# Patient Record
Sex: Male | Born: 1955 | Race: White | Hispanic: No | Marital: Married | Smoking: Never smoker
Health system: Southern US, Community
[De-identification: ages and names within clinical notes are randomized; demographics above are authoritative.]

## PROBLEM LIST (undated history)

## (undated) DIAGNOSIS — E785 Hyperlipidemia, unspecified: Secondary | ICD-10-CM

## (undated) DIAGNOSIS — T7840XA Allergy, unspecified, initial encounter: Secondary | ICD-10-CM

## (undated) HISTORY — DX: Hyperlipidemia, unspecified: E78.5

## (undated) HISTORY — DX: Gilbert syndrome: E80.4

## (undated) HISTORY — DX: Allergy, unspecified, initial encounter: T78.40XA

---

## 2000-04-03 ENCOUNTER — Encounter: Payer: Self-pay | Admitting: Emergency Medicine

## 2000-04-03 ENCOUNTER — Emergency Department (HOSPITAL_COMMUNITY): Admission: EM | Admit: 2000-04-03 | Discharge: 2000-04-03 | Payer: Self-pay | Admitting: Emergency Medicine

## 2000-04-07 ENCOUNTER — Emergency Department (HOSPITAL_COMMUNITY): Admission: EM | Admit: 2000-04-07 | Discharge: 2000-04-07 | Payer: Self-pay | Admitting: Emergency Medicine

## 2000-04-11 ENCOUNTER — Emergency Department (HOSPITAL_COMMUNITY): Admission: EM | Admit: 2000-04-11 | Discharge: 2000-04-11 | Payer: Self-pay | Admitting: Emergency Medicine

## 2004-08-02 HISTORY — PX: UMBILICAL HERNIA REPAIR: SHX196

## 2004-08-19 ENCOUNTER — Ambulatory Visit: Payer: Self-pay | Admitting: Internal Medicine

## 2004-08-20 ENCOUNTER — Ambulatory Visit (HOSPITAL_BASED_OUTPATIENT_CLINIC_OR_DEPARTMENT_OTHER): Admission: RE | Admit: 2004-08-20 | Discharge: 2004-08-20 | Payer: Self-pay | Admitting: General Surgery

## 2004-09-11 ENCOUNTER — Ambulatory Visit: Payer: Self-pay | Admitting: Internal Medicine

## 2004-09-14 ENCOUNTER — Encounter: Admission: RE | Admit: 2004-09-14 | Discharge: 2004-09-14 | Payer: Self-pay | Admitting: Internal Medicine

## 2005-04-16 IMAGING — CR DG HIP (WITH OR WITHOUT PELVIS) 2-3V*L*
3 series · 3 of 3 positions shown · non-contrast
Comparison: none

CLINICAL DATA: Patient has had low back pain for approximately one year.  There is no known injury.  Pain has gradually gotten worse and has a tendency to radiate into the left lower back and hip area.  
 LUMBOSACRAL SPINE COMPLETE:
 AP, lateral, and both oblique views of the lumbar spine show no evidence of fracture, dislocation, or congenital abnormality.  There are degenerative hypertrophic spurs noted at the L2-3 and L4-5 levels.  There is some narrowing of the L5-S1 intervertebral disc space with hypertrophic spurring anteriorly at that level.  The posterior elements appear to be intact.

[view not recorded (1 of 3)]
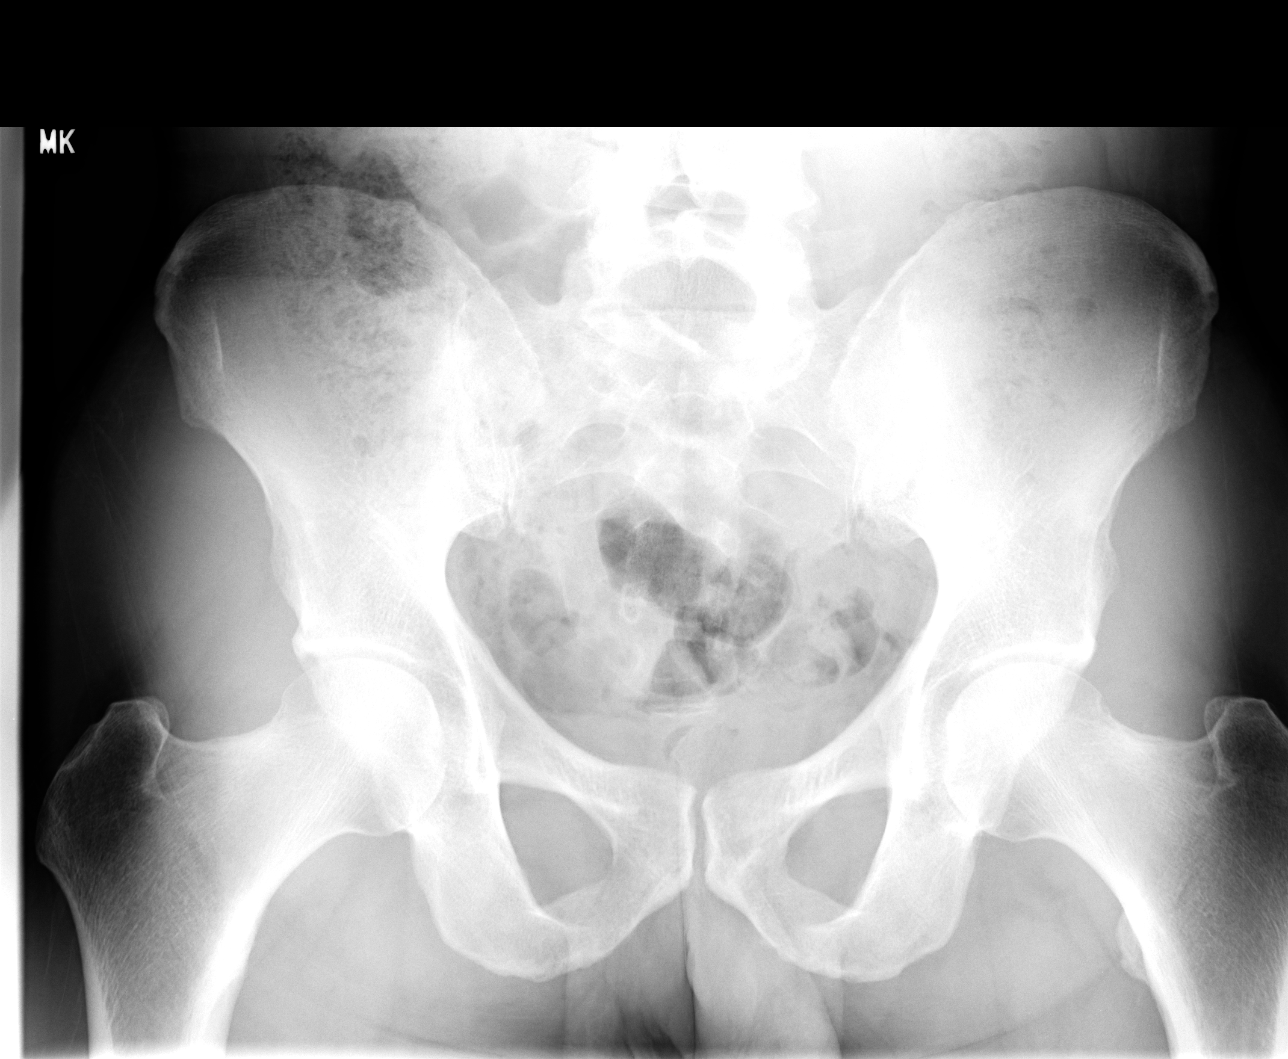

[view not recorded (2 of 3)]
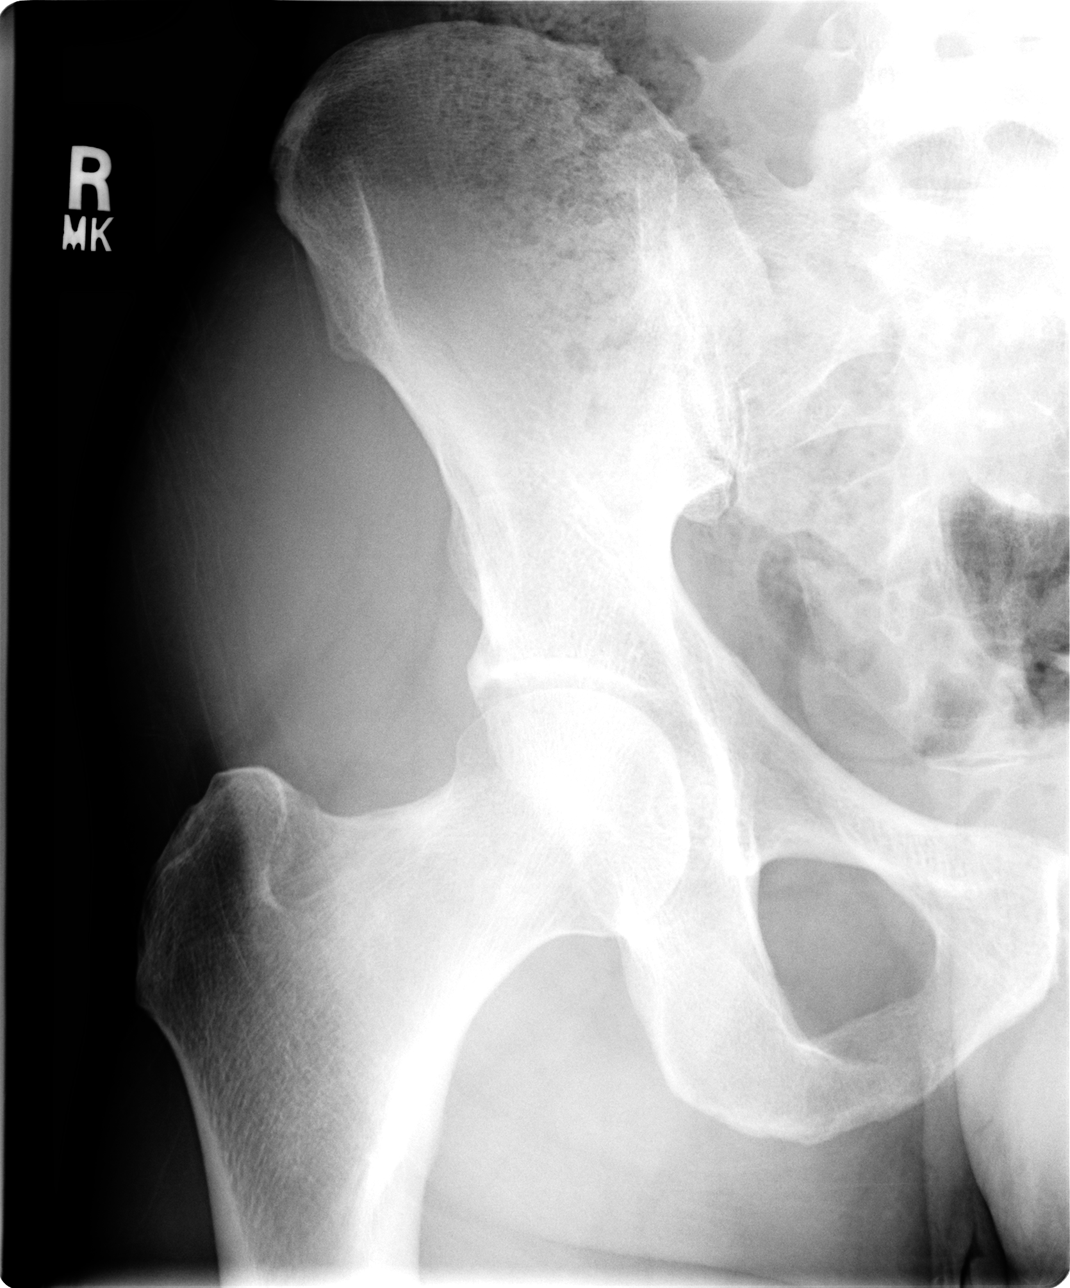

[view not recorded (3 of 3)]
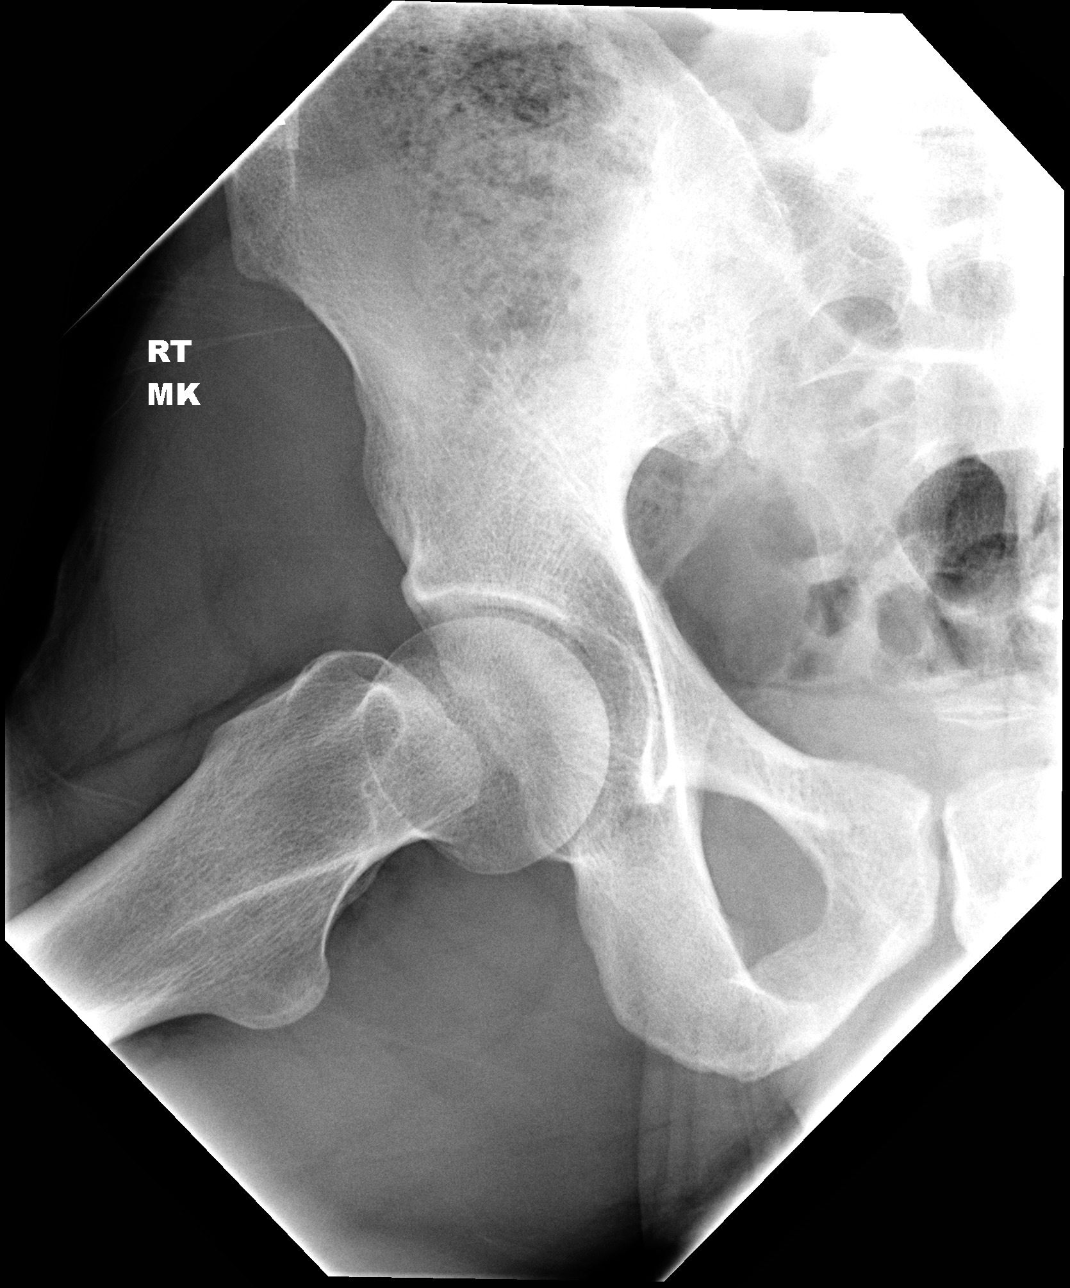

[3 of 3 positions shown; findings below may reference images not displayed]

IMPRESSION: Some degenerative hypertrophic spurring mid and lower lumbar spine.  Mild narrowing L5-S1 joint space suggesting some degenerative disc at that level with some hypertrophic spurring.  
 LEFT HIP COMPLETE:
 AP and lateral views of the left hip as well as a PA view of the pelvis show no evidence of fracture, dislocation, or radiopaque foreign body.  The soft tissues appear normal and the sacroiliac joints are normal.
IMPRESSION: Normal left hip.
 RIGHT HIP COMPLETE:
 AP and lateral views of the right hip show no evidence of fracture, dislocation, or congenital abnormality.  The right sacroiliac joint appears normal.
IMPRESSION: Normal right hip.

## 2005-08-02 HISTORY — PX: LUMBAR EPIDURAL INJECTION: SHX1980

## 2005-12-13 ENCOUNTER — Encounter: Admission: RE | Admit: 2005-12-13 | Discharge: 2005-12-13 | Payer: Self-pay | Admitting: Orthopedic Surgery

## 2005-12-28 ENCOUNTER — Encounter: Admission: RE | Admit: 2005-12-28 | Discharge: 2005-12-28 | Payer: Self-pay | Admitting: Orthopedic Surgery

## 2006-03-04 ENCOUNTER — Encounter: Admission: RE | Admit: 2006-03-04 | Discharge: 2006-03-04 | Payer: Self-pay | Admitting: Orthopedic Surgery

## 2006-11-01 ENCOUNTER — Ambulatory Visit: Payer: Self-pay | Admitting: Internal Medicine

## 2006-11-09 ENCOUNTER — Ambulatory Visit: Payer: Self-pay | Admitting: Internal Medicine

## 2007-04-07 ENCOUNTER — Ambulatory Visit: Payer: Self-pay | Admitting: Internal Medicine

## 2007-04-09 LAB — CONVERTED CEMR LAB
AST: 18 units/L (ref 0–37)
Total CHOL/HDL Ratio: 3.7

## 2007-04-10 ENCOUNTER — Encounter (INDEPENDENT_AMBULATORY_CARE_PROVIDER_SITE_OTHER): Payer: Self-pay | Admitting: *Deleted

## 2008-08-02 HISTORY — PX: COLONOSCOPY: SHX174

## 2008-11-05 ENCOUNTER — Telehealth: Payer: Self-pay | Admitting: Internal Medicine

## 2008-11-06 ENCOUNTER — Ambulatory Visit: Payer: Self-pay | Admitting: Internal Medicine

## 2008-11-06 LAB — CONVERTED CEMR LAB
AST: 19 units/L (ref 0–37)
Albumin: 4.2 g/dL (ref 3.5–5.2)
Basophils Absolute: 0 10*3/uL (ref 0.0–0.1)
Basophils Relative: 0.9 % (ref 0.0–3.0)
Bilirubin, Direct: 0.2 mg/dL (ref 0.0–0.3)
CO2: 31 meq/L (ref 19–32)
Chloride: 105 meq/L (ref 96–112)
Creatinine, Ser: 0.8 mg/dL (ref 0.4–1.5)
Direct LDL: 125.4 mg/dL
GFR calc non Af Amer: 108.08 mL/min (ref >60)
Hemoglobin: 14 g/dL (ref 13.0–17.0)
Lymphocytes Relative: 31.3 % (ref 12.0–46.0)
Lymphs Abs: 1.4 10*3/uL (ref 0.7–4.0)
MCHC: 34.3 g/dL (ref 30.0–36.0)
Monocytes Absolute: 0.4 10*3/uL (ref 0.1–1.0)
Monocytes Relative: 8.8 % (ref 3.0–12.0)
PSA: 0.44 ng/mL (ref 0.10–4.00)
Potassium: 4.5 meq/L (ref 3.5–5.1)
RDW: 13.3 % (ref 11.5–14.6)
Total Protein: 6.8 g/dL (ref 6.0–8.3)
VLDL: 7.4 mg/dL (ref 0.0–40.0)
WBC: 4.4 10*3/uL — ABNORMAL LOW (ref 4.5–10.5)

## 2008-11-08 ENCOUNTER — Encounter (INDEPENDENT_AMBULATORY_CARE_PROVIDER_SITE_OTHER): Payer: Self-pay | Admitting: *Deleted

## 2008-11-08 ENCOUNTER — Ambulatory Visit: Payer: Self-pay | Admitting: Internal Medicine

## 2008-11-08 DIAGNOSIS — R7309 Other abnormal glucose: Secondary | ICD-10-CM | POA: Insufficient documentation

## 2008-11-08 DIAGNOSIS — D239 Other benign neoplasm of skin, unspecified: Secondary | ICD-10-CM | POA: Insufficient documentation

## 2008-11-08 DIAGNOSIS — J309 Allergic rhinitis, unspecified: Secondary | ICD-10-CM | POA: Insufficient documentation

## 2008-11-08 DIAGNOSIS — E785 Hyperlipidemia, unspecified: Secondary | ICD-10-CM | POA: Insufficient documentation

## 2008-11-18 ENCOUNTER — Ambulatory Visit: Payer: Self-pay | Admitting: Internal Medicine

## 2008-11-29 ENCOUNTER — Encounter: Payer: Self-pay | Admitting: Internal Medicine

## 2008-12-02 ENCOUNTER — Ambulatory Visit: Payer: Self-pay | Admitting: Internal Medicine

## 2010-08-30 LAB — CONVERTED CEMR LAB
Basophils Absolute: 0 10*3/uL (ref 0.0–0.1)
Basophils Relative: 0.5 % (ref 0.0–1.0)
Bilirubin, Direct: 0.1 mg/dL (ref 0.0–0.3)
Eosinophils Absolute: 0.1 10*3/uL (ref 0.0–0.6)
Eosinophils Relative: 2.3 % (ref 0.0–5.0)
Glucose, Bld: 86 mg/dL (ref 70–99)
Hemoglobin: 14.9 g/dL (ref 13.0–17.0)
Lymphocytes Relative: 25.9 % (ref 12.0–46.0)
MCHC: 34.1 g/dL (ref 30.0–36.0)
Monocytes Absolute: 0.4 10*3/uL (ref 0.2–0.7)
Monocytes Relative: 6.9 % (ref 3.0–11.0)
Neutro Abs: 3.4 10*3/uL (ref 1.4–7.7)
Neutrophils Relative %: 64.4 % (ref 43.0–77.0)
Platelets: 267 10*3/uL (ref 150–400)
Potassium: 4.5 meq/L (ref 3.5–5.1)
RBC: 4.72 M/uL (ref 4.22–5.81)
RDW: 12.8 % (ref 11.5–14.6)
Total Protein: 7.3 g/dL (ref 6.0–8.3)

## 2010-11-09 ENCOUNTER — Other Ambulatory Visit: Payer: Self-pay | Admitting: Internal Medicine

## 2010-12-05 ENCOUNTER — Other Ambulatory Visit: Payer: Self-pay | Admitting: Internal Medicine

## 2010-12-18 NOTE — Op Note (Signed)
Daniel Jordan, Daniel Jordan               ACCOUNT NO.:  [id number]   MEDICAL RECORD NO.:  [id number]          PATIENT TYPE:  AMB   LOCATION:  [facility]                         FACILITY:  [facility]   PHYSICIAN:  Angelia Mould. Derrell Lolling, M.D.DATE OF BIRTH:  January 10, 1956   DATE OF PROCEDURE:  08/20/2004  DATE OF DISCHARGE:                                 OPERATIVE REPORT   PREOPERATIVE DIAGNOSIS:  Incarcerated umbilical hernia.   POSTOPERATIVE DIAGNOSIS:  Incarcerated epigastric ventral hernia.   OPERATION PERFORMED:  Repair incarcerated epigastric ventral hernia.   SURGEON:  Angelia Mould. Derrell Lolling, M.D.   OPERATIVE INDICATIONS:  This is a 55 year old white male who has had a small  reducible hernia above his umbilicus for some time, but over the last 48  hours it has become incarcerated and is painful. He was seen in the office  yesterday afternoon and he has about a 1.5-2.0 cm tender subcutaneous mass  about 3 cm above the umbilicus. This no overlying skin change. There is no  abdominal distension or abdominal tenderness. He is brought to operating  room for repair.   OPERATIVE TECHNIQUE:  Following the induction of a monitored sedation, the  patient's abdomen was prepped and draped in sterile fashion. 0.5% Marcaine  with epinephrine was used as local infiltration anesthetic. A curved  transverse incision was made at 2 cm above the umbilicus. Dissection was  carried down through subcutaneous tissue until hernia sac was identified.  This appeared to contain a tongue of preperitoneal fat. This was dissected  down to the fascia and freed up. I amputated some of the fatty tissue with  electrocautery and reduced the rest.  I checked inferiorly and he really did  not have an umbilical hernia. The fascia at the umbilicus was intact. I  repaired the hernia defect with three interrupted sutures of 0 Novofil. The  central suture was placed as a vest over pants suture and then the two  corner stitches were simple  sutures. Once all the sutures were placed, they  were pulled up and then tied.  This provided very secure repair.  I did not  think any prosthetic mesh was indicated.  The wound was irrigated with  saline. Subcutaneous tissue was closed with interrupted sutures of 3-0  Vicryl. Skin was closed with running subcuticular suture of 4-0 Vicryl and  Steri-Strips. Clean bandages were placed. The patient taken recovery room in  stable condition.  Estimated blood loss was about 5 cc. Complications none.  Sponge, needle and instrument counts were correct.     Hayw  HMI/MEDQ  D:  08/20/2004  T:  08/20/2004  Job:  [id number]   cc:   Wanda Plump, MD [facility]  [phone] [place], [place] [address]   Titus Dubin. Alwyn Ren, M.D. [facility]

## 2010-12-18 NOTE — Assessment & Plan Note (Signed)
[facility]                        [place] OFFICE NOTE   KORVIN, VALENTINE                      MRN:          [id number]  DATE:11/01/2006                            DOB:          Nov 20, 1955    Mr. Nohr was seen for a physical examination November 01, 2006 at age 55.   He is essentially asymptomatic.   PAST MEDICAL HISTORY:  Unchanged. He had an umbilical hernia repair in  January 2006. He has had dyslipidemia and also Gilbert's syndrome,  benign elevation of total bilirubin.   FAMILY HISTORY:  Hypertension in his mother and myocardial infarction at  age 89 in his father. His father did have a stent in 2005.   He has never smoked. He drinks socially. He has been exercising using  weights and also cardiovascular exercise approximately 60 minutes per  week. He has no cardiopulmonary symptoms with this.   He is presently on no medications and there are no known drug allergies.   REVIEW OF SYSTEMS:  He continues to have significant night sweats  approximately once every 6-7 weeks. This began after a trip to [place] in  July 2005. One of his daughters also had similar phenomena. There is no  history of fever, chills or weight loss or pulmonary symptomatology.   Additionally he has seasonal allergies but does not take medication.   He has noted some decrease in his stream but has no other genitourinary  symptoms.   The remainder of the review of systems was completed in toto and was  negative.   He is 5 foot 11 and weighs 163 fully clothed. Pulse is 72, respiratory  rate 16 and blood pressure 120/74. Pupils were equally round and  reactive to light. Fundal exam unremarkable.   Dental hygiene is excellent. He has no lymphadenopathy about the head,  neck or axilla. Thyroid is normal to palpation. The nasal septum is  slightly deviated. There is no purulence. Tympanic membranes are normal.   The chest is clear to auscultation with no  wheezing.   An S4 was noted with no murmurs or gallops.   He has no lymphadenopathy about the head, neck or axilla.   Abdomen is flat and well muscled. He has no organomegaly.   All pulses are intact and there is no edema.   He does have a  varicosity of the left lower extremity which is present  when standing. It does disappear with elevation.   Musculoskeletal exam is unremarkable. This is significant in that he  previously had significant low back syndrome that was evaluated by an  orthopedist and neurosurgeon. The last evaluation was at [facility] and  conservative treatment was recommended which has resulted in resolution  of symptoms.   Prostate is upper limits of normal to mildly enlarged. He has a  granuloma of his left scrotal area. Hemoccult test is negative.   Neuropsychiatric exam is unremarkable.   In reviewing the prior labs, his LDL has ranged from 122 to 179. With  his father's history of cardiovascular disease, it is imperative to  determine his risk. An NMR LipoProfile  will be performed. It was  recommended that he follow nutritional information in Dr. Gildardo Griffes book  Eat, Drink and be Healthy. Also he is referred to [employer], prevention.com, the Flat Belly Diet.   He was given the option of using loratadine 10 mg daily as needed for  allergic symptoms. He can also use a EchoStar daily as needed.   Saw Palmetto Extract supplement may be of benefit for the minor  genitourinary symptoms. Support hose should address the minor  varicosity.   A goal sheet was provided. It is recommended that cardiovascular  exercise be increased to 30-45 minutes 3 or 4 times a week. EKG reveals  no ischemic changes, no contraindication based on the physical to such  an exercise program.     Titus Dubin. Alwyn Ren, MD,FACP,FCCP  Electronically Signed    [facility]/MedQ  DD: 11/01/2006  DT: 11/01/2006  Job #: [id number]

## 2011-02-13 ENCOUNTER — Encounter: Payer: Self-pay | Admitting: Internal Medicine

## 2011-02-15 ENCOUNTER — Ambulatory Visit (INDEPENDENT_AMBULATORY_CARE_PROVIDER_SITE_OTHER): Payer: BC Managed Care – PPO | Admitting: Internal Medicine

## 2011-02-15 ENCOUNTER — Encounter: Payer: Self-pay | Admitting: Internal Medicine

## 2011-02-15 VITALS — BP 120/86 | HR 69 | Temp 98.2°F | Ht 71.5 in | Wt 161.8 lb

## 2011-02-15 DIAGNOSIS — D239 Other benign neoplasm of skin, unspecified: Secondary | ICD-10-CM

## 2011-02-15 DIAGNOSIS — Z8249 Family history of ischemic heart disease and other diseases of the circulatory system: Secondary | ICD-10-CM

## 2011-02-15 DIAGNOSIS — Z0000 Encounter for general adult medical examination without abnormal findings: Secondary | ICD-10-CM

## 2011-02-15 DIAGNOSIS — E785 Hyperlipidemia, unspecified: Secondary | ICD-10-CM

## 2011-02-15 DIAGNOSIS — Z23 Encounter for immunization: Secondary | ICD-10-CM

## 2011-02-15 LAB — CBC WITH DIFFERENTIAL/PLATELET
Basophils Absolute: 0 10*3/uL (ref 0.0–0.1)
Basophils Relative: 0.7 % (ref 0.0–3.0)
Eosinophils Absolute: 0.2 10*3/uL (ref 0.0–0.7)
HCT: 42.2 % (ref 39.0–52.0)
Hemoglobin: 14.3 g/dL (ref 13.0–17.0)
MCV: 93.7 fl (ref 78.0–100.0)
Monocytes Relative: 8.2 % (ref 3.0–12.0)
RBC: 4.5 Mil/uL (ref 4.22–5.81)
WBC: 4.7 10*3/uL (ref 4.5–10.5)

## 2011-02-15 LAB — BASIC METABOLIC PANEL
CO2: 28 mEq/L (ref 19–32)
Calcium: 8.9 mg/dL (ref 8.4–10.5)

## 2011-02-15 LAB — HEPATIC FUNCTION PANEL
AST: 17 U/L (ref 0–37)
Albumin: 4.6 g/dL (ref 3.5–5.2)
Alkaline Phosphatase: 39 U/L (ref 39–117)
Bilirubin, Direct: 0.2 mg/dL (ref 0.0–0.3)
Total Protein: 7.4 g/dL (ref 6.0–8.3)

## 2011-02-15 LAB — TSH: TSH: 1.63 u[IU]/mL (ref 0.35–5.50)

## 2011-02-15 LAB — LIPID PANEL
LDL Cholesterol: 110 mg/dL — ABNORMAL HIGH (ref 0–99)
Total CHOL/HDL Ratio: 2

## 2011-02-15 MED ORDER — LORATADINE 10 MG PO TABS: 10.0000 mg | ORAL_TABLET | Freq: Every day | ORAL | Status: DC

## 2011-02-15 MED ORDER — TETANUS-DIPHTH-ACELL PERTUSSIS 5-2.5-18.5 LF-MCG/0.5 IM SUSP
0.5000 mL | Freq: Once | INTRAMUSCULAR | Status: AC
  Administered 2011-02-15: 0.5 mL via INTRAMUSCULAR

## 2011-02-15 NOTE — Patient Instructions (Addendum)
Your LDL goal = < 120. Avoid stimulants :decongestants, diet pills, nicotine, caffeine( coffee, tea,cola, chocolate) to excess to prevent  palpitations

## 2011-02-15 NOTE — Progress Notes (Signed)
Subjective:    Patient ID: Daniel Jordan, male    DOB: [birth year], 55 y.o.   MRN: [id number]  HPI  Mr Bifulco  is here for a physical; he has no acute issues.      Review of Systems Patient reports no  vision/ hearing changes,anorexia, weight change, fever ,adenopathy, persistant / recurrent hoarseness, swallowing issues, chest pain,palpitations, edema,persistant / recurrent cough, hemoptysis, dyspnea(rest, exertional, paroxysmal nocturnal), gastrointestinal  bleeding (melena, rectal bleeding), abdominal pain, excessive heart burn, GU symptoms( dysuria, hematuria, pyuria, voiding/incontinence  issues) syncope, focal weakness, memory loss,numbness & tingling, skin/hair/nail changes,depression, anxiety, abnormal bruising/bleeding, or musculoskeletal symptoms/signs.      Objective:   Physical Exam Gen.: Thin but healthy and well-nourished in appearance. Alert, appropriate and cooperative throughout exam. Head: Normocephalic without obvious abnormalities;   goatee Eyes: No corneal or conjunctival inflammation noted. Pupils equal round reactive to light and accommodation. Fundal exam is benign without hemorrhages, exudate, papilledema. Extraocular motion intact. Vision grossly normal. Ears: External  ear exam reveals no significant lesions or deformities. Canals clear .TMs normal. Hearing is grossly normal bilaterally. Nose: External nasal exam reveals no deformity or inflammation. Nasal mucosa are pink and moist. No lesions or exudates noted. Septum  : deviated to R  Mouth: Oral mucosa and oropharynx reveal no lesions or exudates. Teeth in good repair. Neck: No deformities, masses, or tenderness noted. Range of motion & Thyroid normal. Lungs: Normal respiratory effort; chest expands symmetrically. Lungs are clear to auscultation without rales, wheezes, or increased work of breathing. Heart: Normal rate and rhythm. Normal S1 and S2. No gallop, click, or rub. S4 w/o  murmur. Abdomen: Bowel sounds  normal; abdomen soft and nontender. No masses, organomegaly or hernias noted. Genitalia/ DRE: A small granuloma is noted above the left testicle. There is dimpling over the lower spine without fluctuance or tenderness. Prostate is normal   .                                                                                   Musculoskeletal/extremities: No deformity or scoliosis noted of  the thoracic or lumbar spine. No clubbing, cyanosis, edema, or deformity noted. Range of motion  normal .Tone & strength  normal.Joints normal. Nail health  good. Vascular: Carotid, radial artery, dorsalis pedis and  posterior tibial pulses are full and equal. No bruits present. Neurologic: Alert and oriented x3. Deep tendon reflexes symmetrical and normal.          Skin: Intact without suspicious lesions or rashes.Well tanned Lymph: No cervical, axillary, or inguinal lymphadenopathy present. Psych: Mood and affect are normal. Normally interactive                                                                                         Assessment & Plan:  #1 comprehensive physical exam; no acute  findings #2 see Problem List with Assessments & Recommendations Plan: see Orders

## 2014-01-22 ENCOUNTER — Encounter: Payer: Self-pay | Admitting: Internal Medicine

## 2014-01-22 ENCOUNTER — Other Ambulatory Visit (INDEPENDENT_AMBULATORY_CARE_PROVIDER_SITE_OTHER): Payer: BC Managed Care – PPO

## 2014-01-22 ENCOUNTER — Ambulatory Visit (INDEPENDENT_AMBULATORY_CARE_PROVIDER_SITE_OTHER): Payer: BC Managed Care – PPO | Admitting: Internal Medicine

## 2014-01-22 VITALS — BP 146/98 | HR 59 | Temp 98.2°F | Ht 72.0 in | Wt 162.0 lb

## 2014-01-22 DIAGNOSIS — Z0000 Encounter for general adult medical examination without abnormal findings: Secondary | ICD-10-CM

## 2014-01-22 DIAGNOSIS — J309 Allergic rhinitis, unspecified: Secondary | ICD-10-CM

## 2014-01-22 LAB — CBC WITH DIFFERENTIAL/PLATELET
BASOS ABS: 0 10*3/uL (ref 0.0–0.1)
BASOS PCT: 0.4 % (ref 0.0–3.0)
EOS PCT: 5.5 % — AB (ref 0.0–5.0)
Eosinophils Absolute: 0.3 10*3/uL (ref 0.0–0.7)
HEMATOCRIT: 45.4 % (ref 39.0–52.0)
HEMOGLOBIN: 15.2 g/dL (ref 13.0–17.0)
LYMPHS ABS: 1.9 10*3/uL (ref 0.7–4.0)
Lymphocytes Relative: 32.5 % (ref 12.0–46.0)
MCHC: 33.5 g/dL (ref 30.0–36.0)
MCV: 93.5 fl (ref 78.0–100.0)
MONOS PCT: 7.5 % (ref 3.0–12.0)
Monocytes Absolute: 0.4 10*3/uL (ref 0.1–1.0)
NEUTROS ABS: 3.2 10*3/uL (ref 1.4–7.7)
NEUTROS PCT: 54.1 % (ref 43.0–77.0)
Platelets: 233 10*3/uL (ref 150.0–400.0)
RBC: 4.85 Mil/uL (ref 4.22–5.81)
RDW: 14.2 % (ref 11.5–15.5)
WBC: 5.9 10*3/uL (ref 4.0–10.5)

## 2014-01-22 LAB — BASIC METABOLIC PANEL
BUN: 17 mg/dL (ref 6–23)
CALCIUM: 9.6 mg/dL (ref 8.4–10.5)
CO2: 31 meq/L (ref 19–32)
CREATININE: 0.9 mg/dL (ref 0.4–1.5)
Chloride: 100 mEq/L (ref 96–112)
GFR: 94.95 mL/min (ref >60.00)
Glucose, Bld: 97 mg/dL (ref 70–99)
POTASSIUM: 5.2 meq/L — AB (ref 3.5–5.1)
Sodium: 137 mEq/L (ref 135–145)

## 2014-01-22 LAB — HEPATIC FUNCTION PANEL
ALBUMIN: 4.7 g/dL (ref 3.5–5.2)
ALK PHOS: 39 U/L (ref 39–117)
ALT: 16 U/L (ref 0–53)
AST: 19 U/L (ref 0–37)
Bilirubin, Direct: 0.1 mg/dL (ref 0.0–0.3)
Total Bilirubin: 1 mg/dL (ref 0.2–1.2)
Total Protein: 7.4 g/dL (ref 6.0–8.3)

## 2014-01-22 LAB — LIPID PANEL
CHOLESTEROL: 244 mg/dL — AB (ref 0–200)
HDL: 87.5 mg/dL (ref >39.00)
LDL CALC: 148 mg/dL — AB (ref 0–99)
NonHDL: 156.5
TRIGLYCERIDES: 43 mg/dL (ref 0.0–149.0)
Total CHOL/HDL Ratio: 3
VLDL: 8.6 mg/dL (ref 0.0–40.0)

## 2014-01-22 LAB — PSA: PSA: 0.41 ng/mL (ref 0.10–4.00)

## 2014-01-22 LAB — TSH: TSH: 1.53 u[IU]/mL (ref 0.35–4.50)

## 2014-01-22 MED ORDER — AZELASTINE HCL 0.05 % OP SOLN: 1.0000 [drp] | Freq: Two times a day (BID) | OPHTHALMIC | Status: DC

## 2014-01-22 MED ORDER — MONTELUKAST SODIUM 10 MG PO TABS: 10.0000 mg | ORAL_TABLET | Freq: Every day | ORAL | Status: DC

## 2014-01-22 NOTE — Patient Instructions (Signed)
Your next office appointment will be determined based upon review of your pending labs . Those instructions will be transmitted to you through My Chart .  Followup as needed for your acute issue. Please report any significant change in your symptoms. 

## 2014-01-22 NOTE — Progress Notes (Signed)
   Subjective:    Patient ID: Daniel Jordan, male    DOB: August 20, 1955, 58 y.o.   MRN: [id number]  HPI  He is here for a physical;acute issues include extrinsic symptoms.      Review of Systems  He began to have trouble with  allergies seasonally at age 73 when he moved to [place].  The major issues are allergic conjunctivitis.  He has had diminishing response with first loratadine and subsequently Allegra and finally Zyrtec. He is now using a product from [employer] with variable response         Objective:   Physical Exam Gen.: Thin but healthy and well-nourished in appearance. Alert, appropriate and cooperative throughout exam. Appears younger than stated age  Head: Normocephalic without obvious abnormalities; pattern alopecia & goatee. Eyes: No corneal or conjunctival inflammation noted. Pupils equal round reactive to light and accommodation. Extraocular motion intact. The sclerae are injected.  Ears: External  ear exam reveals no significant lesions or deformities. Canals clear .TMs normal. Hearing is grossly decreased on left.y. Nose: External nasal exam reveals no deformity or inflammation. Nasal mucosa are pink and moist. No lesions or exudates noted. Septum deviated to right. Mouth: Oral mucosa and oropharynx reveal no lesions or exudates. Teeth in good repair. Neck: No deformities, masses, or tenderness noted. Range of motion & Thyroid normal. Lungs: Normal respiratory effort; chest expands symmetrically. Lungs are clear to auscultation without rales, wheezes, or increased work of breathing. Heart: Normal rate and rhythm. Normal S1 and S2. No gallop, or rub. Apical click w/o  murmur. Abdomen: Bowel sounds normal; abdomen soft and nontender. No masses, organomegaly or hernias noted. Genitalia: Genitalia normal except for left varices. Prostate is symmetrically enlarged 1.5 -1.75 without nodularity or induration. Musculoskeletal/extremities: No deformity or scoliosis  noted of  the thoracic or lumbar spine.   No clubbing, cyanosis, edema, or significant extremity  deformity noted. Range of motion normal .Tone & strength normal. Hand joints normal  Fingernail  health good. Able to lie down & sit up w/o help. Negative SLR bilaterally Vascular: Carotid, radial artery, dorsalis pedis and  posterior tibial pulses are full and equal. No bruits present. Neurologic: Alert and oriented x3. Deep tendon reflexes symmetrical and normal.  Gait normal.      Skin: Intact without suspicious lesions or rashes. Lymph: No cervical, axillary, or inguinal lymphadenopathy present. Psych: Mood and affect are normal. Normally interactive                                                                                        Assessment & Plan:  #1 comprehensive physical exam; no acute findings #2 Extrinsic conjunctivitis #3 BPH  Plan: see Orders  & Recommendations

## 2014-01-22 NOTE — Progress Notes (Signed)
Pre visit review using our clinic review tool, if applicable. No additional management support is needed unless otherwise documented below in the visit note. 

## 2016-01-20 DIAGNOSIS — M7542 Impingement syndrome of left shoulder: Secondary | ICD-10-CM | POA: Diagnosis not present

## 2016-01-20 DIAGNOSIS — M25512 Pain in left shoulder: Secondary | ICD-10-CM | POA: Diagnosis not present

## 2016-01-28 ENCOUNTER — Other Ambulatory Visit: Payer: Self-pay | Admitting: Sports Medicine

## 2016-01-28 DIAGNOSIS — M25512 Pain in left shoulder: Secondary | ICD-10-CM

## 2016-02-05 ENCOUNTER — Ambulatory Visit
Admission: RE | Admit: 2016-02-05 | Discharge: 2016-02-05 | Disposition: A | Discharge status: Home / Self Care | Payer: BLUE CROSS/BLUE SHIELD | Source: Ambulatory Visit | Attending: Sports Medicine | Admitting: Sports Medicine

## 2016-02-05 ENCOUNTER — Other Ambulatory Visit: Payer: Self-pay | Admitting: Sports Medicine

## 2016-02-05 DIAGNOSIS — M25512 Pain in left shoulder: Secondary | ICD-10-CM | POA: Diagnosis not present

## 2016-02-05 DIAGNOSIS — H0553 Retained (old) foreign body following penetrating wound of bilateral orbits: Secondary | ICD-10-CM

## 2016-02-05 DIAGNOSIS — Z189 Retained foreign body fragments, unspecified material: Principal | ICD-10-CM

## 2016-03-29 DIAGNOSIS — M25512 Pain in left shoulder: Secondary | ICD-10-CM | POA: Diagnosis not present

## 2016-07-21 ENCOUNTER — Encounter: Payer: BLUE CROSS/BLUE SHIELD | Admitting: Family

## 2016-07-21 ENCOUNTER — Encounter: Payer: Self-pay | Admitting: Family

## 2016-07-22 NOTE — Progress Notes (Signed)
Error

## 2016-09-22 ENCOUNTER — Ambulatory Visit (INDEPENDENT_AMBULATORY_CARE_PROVIDER_SITE_OTHER): Payer: BLUE CROSS/BLUE SHIELD | Admitting: Adult Health

## 2016-09-22 VITALS — BP 148/72 | Temp 98.1°F | Ht 72.0 in | Wt 159.0 lb

## 2016-09-22 DIAGNOSIS — J0100 Acute maxillary sinusitis, unspecified: Secondary | ICD-10-CM

## 2016-09-22 MED ORDER — DOXYCYCLINE HYCLATE 100 MG PO CAPS: 100.0000 mg | ORAL_CAPSULE | Freq: Two times a day (BID) | ORAL | 0 refills | Status: DC

## 2016-09-22 NOTE — Progress Notes (Signed)
Subjective:    Patient ID: Daniel Jordan, male    DOB: 08-14-1955, 60 y.o.   MRN: [id number]  Sinusitis  This is a new problem. The current episode started 1 to 4 weeks ago (2 weeks ). Associated symptoms include congestion, coughing, ear pain, headaches and sinus pressure. Pertinent negatives include no shortness of breath, sore throat or swollen glands. Past treatments include oral decongestants. The treatment provided no relief.      Review of Systems  Constitutional: Negative.   HENT: Positive for congestion, ear pain, postnasal drip, rhinorrhea, sinus pain and sinus pressure. Negative for sore throat.   Respiratory: Positive for cough. Negative for chest tightness, shortness of breath and wheezing.   Cardiovascular: Negative.   Neurological: Positive for headaches.  All other systems reviewed and are negative.  Past Medical History:  Diagnosis Date  . Allergy    seasonal  . Gilbert's syndrome   . Hyperlipidemia     Social History   Social History  . Marital status: Married    Spouse name: N/A  . Number of children: N/A  . Years of education: N/A   Occupational History  . Not on file.   Social History Main Topics  . Smoking status: Never Smoker  . Smokeless tobacco: Not on file  . Alcohol use 8.4 oz/week    7 Glasses of wine, 7 Cans of beer per week  . Drug use: No  . Sexual activity: Not on file   Other Topics Concern  . Not on file   Social History Narrative  . No narrative on file    Past Surgical History:  Procedure Laterality Date  . COLONOSCOPY  2010   [place],Negative  . LUMBAR EPIDURAL INJECTION  2007   3 ruptured discs  . UMBILICAL HERNIA REPAIR  2006   Dr Dalbert Batman    Family History  Problem Relation Age of Onset  . Hypertension Mother   . Heart attack Father 21  . Heart attack Brother 29  . Cancer Neg Hx   . Diabetes Neg Hx   . Stroke Neg Hx     No Known Allergies  No current outpatient prescriptions on file prior to visit.    No current facility-administered medications on file prior to visit.     BP (!) 148/72   Temp 98.1 F (36.7 C) (Oral)   Ht 6' (1.829 m)   Wt 159 lb (72.1 kg)   BMI 21.56 kg/m       Objective:   Physical Exam  Constitutional: He is oriented to person, place, and time. He appears well-developed and well-nourished. No distress.  HENT:  Head: Normocephalic and atraumatic.  Right Ear: Hearing, tympanic membrane, external ear and ear canal normal.  Left Ear: Hearing, tympanic membrane, external ear and ear canal normal.  Nose: Right sinus exhibits maxillary sinus tenderness and frontal sinus tenderness. Left sinus exhibits maxillary sinus tenderness and frontal sinus tenderness.  Mouth/Throat: Oropharynx is clear and moist. No oropharyngeal exudate.  Eyes: Conjunctivae and EOM are normal. Pupils are equal, round, and reactive to light. Right eye exhibits no discharge. Left eye exhibits no discharge.  Neck: Normal range of motion. Neck supple. No thyromegaly present.  Cardiovascular: Normal rate, regular rhythm, normal heart sounds and intact distal pulses.  Exam reveals no gallop and no friction rub.   No murmur heard. Pulmonary/Chest: Effort normal and breath sounds normal. No respiratory distress. He has no wheezes. He has no rales. He exhibits no tenderness.  Lymphadenopathy:  He has no cervical adenopathy.  Neurological: He is alert and oriented to person, place, and time.  Skin: Skin is warm and dry. No rash noted. He is not diaphoretic. No erythema. No pallor.  Psychiatric: He has a normal mood and affect. His behavior is normal. Judgment and thought content normal.  Nursing note and vitals reviewed.     Assessment & Plan:  1. Acute non-recurrent maxillary sinusitis - doxycycline (VIBRAMYCIN) 100 MG capsule; Take 1 capsule (100 mg total) by mouth 2 (two) times daily.  Dispense: 14 capsule; Refill: 0 - Add flonase  - Follow up if no improvement in the last 2-3 days  Dorothyann Peng, NP

## 2016-11-11 ENCOUNTER — Encounter: Payer: Self-pay | Admitting: Family Medicine

## 2016-11-11 ENCOUNTER — Ambulatory Visit (INDEPENDENT_AMBULATORY_CARE_PROVIDER_SITE_OTHER): Payer: BLUE CROSS/BLUE SHIELD | Admitting: Family Medicine

## 2016-11-11 VITALS — BP 142/92 | HR 76 | Temp 97.8°F | Ht 71.5 in | Wt 161.6 lb

## 2016-11-11 DIAGNOSIS — Z125 Encounter for screening for malignant neoplasm of prostate: Secondary | ICD-10-CM

## 2016-11-11 DIAGNOSIS — Z0000 Encounter for general adult medical examination without abnormal findings: Secondary | ICD-10-CM

## 2016-11-11 DIAGNOSIS — E7800 Pure hypercholesterolemia, unspecified: Secondary | ICD-10-CM | POA: Diagnosis not present

## 2016-11-11 NOTE — Assessment & Plan Note (Signed)
S: suspect poorly controlled on no medicine. No myalgias.  Lab Results  Component Value Date   CHOL 244 (H) 01/22/2014   HDL 87.50 01/22/2014   LDLCALC 148 (H) 01/22/2014   LDLDIRECT 125.4 11/06/2008   TRIG 43.0 01/22/2014   CHOLHDL 3 01/22/2014   A/P: will update lipids. Calculate 10 year risk- may wait until BP in better position to calculate

## 2016-11-11 NOTE — Patient Instructions (Addendum)
Schedule a lab visit at the check out desk within 2 weeks. Return for future fasting labs meaning nothing but water after midnight please. Ok to take your medications with water.   Your blood pressure trend concerns me. I would like for you to buy/use a home cuff to check at least 5x a week. Your goal is <140/90.  see me in6-8 weeks. Bring your home cuff and your log of blood pressures with you to visit. The [employer] has been proven to lower blood pressure. You already have an excellent exercise pattern so would not change your pattern.    DASH Eating Plan DASH stands for "Dietary Approaches to Stop Hypertension." The DASH eating plan is a healthy eating plan that has been shown to reduce high blood pressure (hypertension). It may also reduce your risk for type 2 diabetes, heart disease, and stroke. The DASH eating plan may also help with weight loss. What are tips for following this plan? General guidelines   Avoid eating more than 2,300 mg (milligrams) of salt (sodium) a day. If you have hypertension, you may need to reduce your sodium intake to 1,500 mg a day.  Limit alcohol intake to no more than 1 drink a day for nonpregnant women and 2 drinks a day for men. One drink equals 12 oz of beer, 5 oz of wine, or 1 oz of hard liquor.  Work with your health care provider to maintain a healthy body weight or to lose weight. Ask what an ideal weight is for you.  Get at least 30 minutes of exercise that causes your heart to beat faster (aerobic exercise) most days of the week. Activities may include walking, swimming, or biking.  Work with your health care provider or diet and nutrition specialist (dietitian) to adjust your eating plan to your individual calorie needs. Reading food labels   Check food labels for the amount of sodium per serving. Choose foods with less than 5 percent of the Daily Value of sodium. Generally, foods with less than 300 mg of sodium per serving fit into this eating  plan.  To find whole grains, look for the word "whole" as the first word in the ingredient list. Shopping   Buy products labeled as "low-sodium" or "no salt added."  Buy fresh foods. Avoid canned foods and premade or frozen meals. Cooking   Avoid adding salt when cooking. Use salt-free seasonings or herbs instead of table salt or sea salt. Check with your health care provider or pharmacist before using salt substitutes.  Do not fry foods. Cook foods using healthy methods such as baking, boiling, grilling, and broiling instead.  Cook with heart-healthy oils, such as olive, canola, soybean, or sunflower oil. Meal planning    Eat a balanced diet that includes:  5 or more servings of fruits and vegetables each day. At each meal, try to fill half of your plate with fruits and vegetables.  Up to 6-8 servings of whole grains each day.  Less than 6 oz of lean meat, poultry, or fish each day. A 3-oz serving of meat is about the same size as a deck of cards. One egg equals 1 oz.  2 servings of low-fat dairy each day.  A serving of nuts, seeds, or beans 5 times each week.  Heart-healthy fats. Healthy fats called Omega-3 fatty acids are found in foods such as flaxseeds and coldwater fish, like sardines, salmon, and mackerel.  Limit how much you eat of the following:  Canned or  prepackaged foods.  Food that is high in trans fat, such as fried foods.  Food that is high in saturated fat, such as fatty meat.  Sweets, desserts, sugary drinks, and other foods with added sugar.  Full-fat dairy products.  Do not salt foods before eating.  Try to eat at least 2 vegetarian meals each week.  Eat more home-cooked food and less restaurant, buffet, and fast food.  When eating at a restaurant, ask that your food be prepared with less salt or no salt, if possible. What foods are recommended? The items listed may not be a complete list. Talk with your dietitian about what dietary choices are  best for you. Grains  Whole-grain or whole-wheat bread. Whole-grain or whole-wheat pasta. Brown rice. [place] Morrow. Bulgur. Whole-grain and low-sodium cereals. Pita bread. Low-fat, low-sodium crackers. Whole-wheat flour tortillas. Vegetables  Fresh or frozen vegetables (raw, steamed, roasted, or grilled). Low-sodium or reduced-sodium tomato and vegetable juice. Low-sodium or reduced-sodium tomato sauce and tomato paste. Low-sodium or reduced-sodium canned vegetables. Fruits  All fresh, dried, or frozen fruit. Canned fruit in natural juice (without added sugar). Meat and other protein foods  Skinless chicken or [place]. Ground chicken or [place]. Pork with fat trimmed off. Fish and seafood. Egg whites. Dried beans, peas, or lentils. Unsalted nuts, nut butters, and seeds. Unsalted canned beans. Lean cuts of beef with fat trimmed off. Low-sodium, lean deli meat. Dairy  Low-fat (1%) or fat-free (skim) milk. Fat-free, low-fat, or reduced-fat cheeses. Nonfat, low-sodium ricotta or cottage cheese. Low-fat or nonfat yogurt. Low-fat, low-sodium cheese. Fats and oils  Soft margarine without trans fats. Vegetable oil. Low-fat, reduced-fat, or light mayonnaise and salad dressings (reduced-sodium). Canola, safflower, olive, soybean, and sunflower oils. Avocado. Seasoning and other foods  Herbs. Spices. Seasoning mixes without salt. Unsalted popcorn and pretzels. Fat-free sweets. What foods are not recommended? The items listed may not be a complete list. Talk with your dietitian about what dietary choices are best for you. Grains  Baked goods made with fat, such as croissants, muffins, or some breads. Dry pasta or rice meal packs. Vegetables  Creamed or fried vegetables. Vegetables in a cheese sauce. Regular canned vegetables (not low-sodium or reduced-sodium). Regular canned tomato sauce and paste (not low-sodium or reduced-sodium). Regular tomato and vegetable juice (not low-sodium or reduced-sodium).  Angie Fava. Olives. Fruits  Canned fruit in a light or heavy syrup. Fried fruit. Fruit in cream or butter sauce. Meat and other protein foods  Fatty cuts of meat. Ribs. Fried meat. Berniece Salines. Sausage. Bologna and other processed lunch meats. Salami. Fatback. Hotdogs. Bratwurst. Salted nuts and seeds. Canned beans with added salt. Canned or smoked fish. Whole eggs or egg yolks. Chicken or [place] with skin. Dairy  Whole or 2% milk, cream, and half-and-half. Whole or full-fat cream cheese. Whole-fat or sweetened yogurt. Full-fat cheese. Nondairy creamers. Whipped toppings. Processed cheese and cheese spreads. Fats and oils  Butter. Stick margarine. Lard. Shortening. Ghee. Bacon fat. Tropical oils, such as coconut, palm kernel, or palm oil. Seasoning and other foods  Salted popcorn and pretzels. Onion salt, garlic salt, seasoned salt, table salt, and sea salt. Worcestershire sauce. Tartar sauce. Barbecue sauce. Teriyaki sauce. Soy sauce, including reduced-sodium. Steak sauce. Canned and packaged gravies. Fish sauce. Oyster sauce. Cocktail sauce. Horseradish that you find on the shelf. Ketchup. Mustard. Meat flavorings and tenderizers. Bouillon cubes. Hot sauce and Tabasco sauce. Premade or packaged marinades. Premade or packaged taco seasonings. Relishes. Regular salad dressings. Where to find more information:  National Heart,  Lung, and Blood Institute: https://wilson-eaton.com/  American Heart Association: www.heart.org Summary  The DASH eating plan is a healthy eating plan that has been shown to reduce high blood pressure (hypertension). It may also reduce your risk for type 2 diabetes, heart disease, and stroke.  With the DASH eating plan, you should limit salt (sodium) intake to 2,300 mg a day. If you have hypertension, you may need to reduce your sodium intake to 1,500 mg a day.  When on the DASH eating plan, aim to eat more fresh fruits and vegetables, whole grains, lean proteins, low-fat dairy, and  heart-healthy fats.  Work with your health care provider or diet and nutrition specialist (dietitian) to adjust your eating plan to your individual calorie needs. This information is not intended to replace advice given to you by your health care provider. Make sure you discuss any questions you have with your health care provider. Document Released: 07/08/2011 Document Revised: 07/12/2016 Document Reviewed: 07/12/2016 Elsevier Interactive Patient Education  2017 [employer].

## 2016-11-11 NOTE — Progress Notes (Signed)
Phone: [phone]  Subjective:  Patient presents today to establish care with me as their new primary care provider. Patient was formerly a patient of Dr. Linna Darner. Chief complaint-noted.   See problem oriented charting ROS- full ROS completed and negative except for sneezing, coughing, watery itchy eyes at times- prior headache with this that resolved  The following were reviewed and entered/updated in epic: Past Medical History:  Diagnosis Date  . Allergy    seasonal  . Gilbert's syndrome   . Hyperlipidemia    Patient Active Problem List   Diagnosis Date Noted  . Hyperlipidemia 11/08/2008    Priority: Medium  . FASTING HYPERGLYCEMIA 11/08/2008    Priority: Medium  . Family history of ischemic heart disease 02/15/2011    Priority: Low  . NEVI, MULTIPLE 11/08/2008    Priority: Low  . 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 ALLERGIC RHINITIS 11/08/2008    Priority: Low   Past Surgical History:  Procedure Laterality Date  . COLONOSCOPY  2010   [place],Negative  . LUMBAR EPIDURAL INJECTION  2007   3 ruptured discs  . UMBILICAL HERNIA REPAIR  2006   Dr Dalbert Batman    Family History  Problem Relation Age of Onset  . Hypertension Mother     retinitis pigmentosa females only  . Diabetes Mother   . Heart attack Father 46    cataracts on fathers side  . Hyperlipidemia Father   . Other Brother     does not know health history well  . Healthy Sister   . Emphysema Paternal Grandfather   . Healthy Brother   . Cancer Neg Hx   . Stroke Neg Hx     Medications- reviewed and updated, no regular meds  Allergies-reviewed and updated No Known Allergies  Social History   Social History  . Marital status: Married    Spouse name: N/A  . Number of children: N/A  . Years of education: N/A   Social History Main Topics  . Smoking status: Never Smoker  . Smokeless tobacco: Never Used  . Alcohol use 8.4 oz/week    7 Glasses of wine, 7 Cans of beer per week  . Drug  use: No  . Sexual activity: Not Asked   Other Topics Concern  . None   Social History Narrative   Married 1980. 4 children. 8 grandkids.       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       Hobbies: fish, hunt.     Objective: BP (!) 142/92   Pulse 76   Temp 97.8 F (36.6 C) (Oral)   Ht 5' 11.5" (1.816 m)   Wt 161 lb 9.6 oz (73.3 kg)   SpO2 94%   BMI 22.22 kg/m  Gen: NAD, resting comfortably HEENT: Mucous membranes are moist. Oropharynx normal Neck: no thyromegaly CV: RRR no murmurs rubs or gallops Lungs: CTAB no crackles, wheeze, rhonchi Abdomen: soft/nontender/nondistended/normal bowel sounds. No rebound or guarding.  Ext: no edema Skin: warm, dry Neuro: grossly normal, moves all extremities, PERRLA Rectal: normal tone, normal sized prostate, no masses or tenderness  Assessment/Plan:  61 y.o. male presenting for annual physical.  Health Maintenance counseling: 1. Anticipatory guidance: Patient counseled regarding regular dental exams- q 66months, eye exams- last year, wearing seatbelts.  2. Risk factor reduction:  Advised patient of need for regular exercise and diet rich and fruits and vegetables to reduce risk of heart attack and stroke. In the gym  everyday. Also eats a well balanced diet.  3. Immunizations/screenings/ancillary studies Immunization History  Administered Date(s) Administered  . Tdap 02/15/2011   Health Maintenance Due  Topic Date Due  . Hepatitis C Screening - declines 15-Jan-1956  . HIV Screening - declines as low risk 04/18/1972   4. Prostate cancer screening-  low risk rectal, get PSA today Lab Results  Component Value Date   PSA 0.41 01/22/2014   PSA 0.20 02/15/2011   PSA 0.44 11/06/2008   5. Colon cancer screening - 2010 with 10 year repeat 6. Skin cancer screening- goes to Dr. [place] Crane if any concerning skin lesoins. No cancers- had a few moles removed. Uses sunscreen  Potential hypertension S:  controlled poorly on no medicine.  BP Readings from Last 3 Encounters:  11/11/16 (!) 142/92  09/22/16 (!) 148/72  01/22/14 (!) 146/98  A/P: Home monitoring, home cuff verification, DASH diet before diagnosing with this  Allergies- on nothing. Doxycycline did not help for possibel sinusitis- was more allergies- treated a few months ago  Hyperlipidemia S: suspect poorly controlled on no medicine. No myalgias.  Lab Results  Component Value Date   CHOL 244 (H) 01/22/2014   HDL 87.50 01/22/2014   LDLCALC 148 (H) 01/22/2014   LDLDIRECT 125.4 11/06/2008   TRIG 43.0 01/22/2014   CHOLHDL 3 01/22/2014   A/P: will update lipids. Calculate 10 year risk- may wait until BP in better position to calculate   Return in about 8 weeks (around 01/06/2017).  Orders Placed This Encounter  Procedures  . PSA    Standing Status:   Future    Standing Expiration Date:   11/11/2017  . Lipid panel    Standing Status:   Future    Standing Expiration Date:   11/11/2017  . CBC with Differential/Platelet    Standing Status:   Future    Standing Expiration Date:   11/11/2017  . Comprehensive metabolic panel        Standing Status:   Future    Standing Expiration Date:   11/11/2017   Return precautions advised.  Garret Reddish, MD

## 2016-11-11 NOTE — Progress Notes (Signed)
Pre visit review using our clinic review tool, if applicable. No additional management support is needed unless otherwise documented below in the visit note. 

## 2016-11-12 ENCOUNTER — Other Ambulatory Visit (INDEPENDENT_AMBULATORY_CARE_PROVIDER_SITE_OTHER): Payer: BLUE CROSS/BLUE SHIELD

## 2016-11-12 DIAGNOSIS — Z125 Encounter for screening for malignant neoplasm of prostate: Secondary | ICD-10-CM | POA: Diagnosis not present

## 2016-11-12 DIAGNOSIS — Z0000 Encounter for general adult medical examination without abnormal findings: Secondary | ICD-10-CM

## 2016-11-12 DIAGNOSIS — E7800 Pure hypercholesterolemia, unspecified: Secondary | ICD-10-CM

## 2016-11-12 LAB — CBC WITH DIFFERENTIAL/PLATELET
BASOS PCT: 0.6 % (ref 0.0–3.0)
Basophils Absolute: 0 10*3/uL (ref 0.0–0.1)
EOS PCT: 1.4 % (ref 0.0–5.0)
Eosinophils Absolute: 0.1 10*3/uL (ref 0.0–0.7)
HEMATOCRIT: 43.8 % (ref 39.0–52.0)
HEMOGLOBIN: 14.5 g/dL (ref 13.0–17.0)
LYMPHS PCT: 23.8 % (ref 12.0–46.0)
Lymphs Abs: 1.1 10*3/uL (ref 0.7–4.0)
MCHC: 33.2 g/dL (ref 30.0–36.0)
MCV: 94.8 fl (ref 78.0–100.0)
MONOS PCT: 8.7 % (ref 3.0–12.0)
Monocytes Absolute: 0.4 10*3/uL (ref 0.1–1.0)
Neutro Abs: 2.9 10*3/uL (ref 1.4–7.7)
Neutrophils Relative %: 65.5 % (ref 43.0–77.0)
Platelets: 242 10*3/uL (ref 150.0–400.0)
RBC: 4.61 Mil/uL (ref 4.22–5.81)
RDW: 15.2 % (ref 11.5–15.5)
WBC: 4.5 10*3/uL (ref 4.0–10.5)

## 2016-11-12 LAB — COMPREHENSIVE METABOLIC PANEL
ALBUMIN: 4.6 g/dL (ref 3.5–5.2)
ALK PHOS: 35 U/L — AB (ref 39–117)
ALT: 13 U/L (ref 0–53)
AST: 15 U/L (ref 0–37)
BUN: 14 mg/dL (ref 6–23)
CALCIUM: 9.4 mg/dL (ref 8.4–10.5)
CO2: 30 mEq/L (ref 19–32)
Chloride: 102 mEq/L (ref 96–112)
Creatinine, Ser: 0.9 mg/dL (ref 0.40–1.50)
GFR: 91.62 mL/min (ref >60.00)
Glucose, Bld: 90 mg/dL (ref 70–99)
POTASSIUM: 4.8 meq/L (ref 3.5–5.1)
Sodium: 139 mEq/L (ref 135–145)
TOTAL PROTEIN: 6.8 g/dL (ref 6.0–8.3)
Total Bilirubin: 1.3 mg/dL — ABNORMAL HIGH (ref 0.2–1.2)

## 2016-11-12 LAB — LIPID PANEL
CHOLESTEROL: 196 mg/dL (ref 0–200)
HDL: 72.3 mg/dL (ref >39.00)
LDL Cholesterol: 113 mg/dL — ABNORMAL HIGH (ref 0–99)
NONHDL: 123.77
Total CHOL/HDL Ratio: 3
Triglycerides: 53 mg/dL (ref 0.0–149.0)
VLDL: 10.6 mg/dL (ref 0.0–40.0)

## 2016-11-12 LAB — PSA: PSA: 0.38 ng/mL (ref 0.10–4.00)

## 2016-12-31 ENCOUNTER — Ambulatory Visit (INDEPENDENT_AMBULATORY_CARE_PROVIDER_SITE_OTHER): Payer: BLUE CROSS/BLUE SHIELD | Admitting: Family Medicine

## 2016-12-31 ENCOUNTER — Encounter: Payer: Self-pay | Admitting: Family Medicine

## 2016-12-31 DIAGNOSIS — Z8249 Family history of ischemic heart disease and other diseases of the circulatory system: Secondary | ICD-10-CM

## 2016-12-31 DIAGNOSIS — E785 Hyperlipidemia, unspecified: Secondary | ICD-10-CM | POA: Diagnosis not present

## 2016-12-31 DIAGNOSIS — I10 Essential (primary) hypertension: Secondary | ICD-10-CM | POA: Diagnosis not present

## 2016-12-31 MED ORDER — AMLODIPINE BESYLATE 5 MG PO TABS: 5.0000 mg | ORAL_TABLET | Freq: Every day | ORAL | 3 refills | Status: DC

## 2016-12-31 NOTE — Progress Notes (Signed)
Subjective:  Daniel Jordan is a 61 y.o. year old very pleasant male patient who presents for/with See problem oriented charting ROS- No chest pain or shortness of breath. No headache or blurry vision.     Past Medical History-  Patient Active Problem List   Diagnosis Date Noted  . Hypertension 12/31/2016    Priority: Medium  . Hyperlipidemia 11/08/2008    Priority: Medium  . FASTING HYPERGLYCEMIA 11/08/2008    Priority: Medium  . Family history of ischemic heart disease 02/15/2011    Priority: Low  . NEVI, MULTIPLE 11/08/2008    Priority: Low  . 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 ALLERGIC RHINITIS 11/08/2008    Priority: Low    Medications- reviewed and updated, none  Objective: BP (!) 142/96 (BP Location: Left Arm, Patient Position: Sitting, Cuff Size: Large)   Pulse 75   Temp 97.7 F (36.5 C) (Oral)   Ht 5' 11.5" (1.816 m)   Wt 156 lb 6.4 oz (70.9 kg)   SpO2 97%   BMI 21.51 kg/m  Gen: NAD, resting comfortably CV: RRR no murmurs rubs or gallops Lungs: CTAB no crackles, wheeze, rhonchi Abdomen: normal weight Ext: no edema Skin: warm, dry Neuro: grossly normal, moves all extremities  Assessment/Plan:  Hypertension S: controlled poorly on no rx. Has lost 5 lbs on dash diet since last visit. Eating less just because he has been busy painting and with kids.  Of 48 readings.  14/48 readings above goal 140. 17/48 above goal for diastolic.  ASCVD 10 year risk calculation if age 28-79: see hyperlipidemia section BP Readings from Last 3 Encounters:  12/31/16 (!) 142/96. Home cuff 141/93.   11/11/16 (!) 142/92  09/22/16 (!) 148/72  A/P: We discussed blood pressure goal of <140/90. Start amlodipine 5mg  with 6 week follow up  Hyperlipidemia S: mild poorly controlled on no rx. 10 year risk of 7.1%. No myalgias.  Lab Results  Component Value Date   CHOL 196 11/12/2016   HDL 72.30 11/12/2016   LDLCALC 113 (H) 11/12/2016   LDLDIRECT 125.4 11/06/2008   TRIG 53.0 11/12/2016   CHOLHDL 3 11/12/2016   A/P: we discussed lowering risk with lowering BP. There is a pretty strong family history here- both brothers and father. Patient mentioned a home remedy that Dr. Linna Darner recommended- clarify name next visit. With BP controlled risk would be closer to 5%. I would certainly consider statin above 7.5% with his history- if he wants to be more aggressive we could use 5%  Meds ordered this encounter  Medications  . amLODipine (NORVASC) 5 MG tablet    Sig: Take 1 tablet (5 mg total) by mouth daily.    Dispense:  90 tablet    Refill:  3   Return precautions advised.  Garret Reddish, MD

## 2016-12-31 NOTE — Patient Instructions (Signed)
Start amlodipine 5mg    Follow up in 4-6 weeks for recheck

## 2016-12-31 NOTE — Assessment & Plan Note (Signed)
S: mild poorly controlled on no rx. 10 year risk of 7.1%. No myalgias.  Lab Results  Component Value Date   CHOL 196 11/12/2016   HDL 72.30 11/12/2016   LDLCALC 113 (H) 11/12/2016   LDLDIRECT 125.4 11/06/2008   TRIG 53.0 11/12/2016   CHOLHDL 3 11/12/2016   A/P: we discussed lowering risk with lowering BP. There is a pretty strong family history here- both brothers and father. Patient mentioned a home remedy that Dr. Linna Darner recommended- clarify name next visit. With BP controlled risk would be closer to 5%. I would certainly consider statin above 7.5% with his history- if he wants to be more aggressive we could use 5%

## 2016-12-31 NOTE — Assessment & Plan Note (Signed)
S: controlled poorly on no rx. Has lost 5 lbs on dash diet since last visit. Eating less just because he has been busy painting and with kids.  Of 48 readings.  14/48 readings above goal 140. 17/48 above goal for diastolic.  ASCVD 10 year risk calculation if age 60-79: see hyperlipidemia section BP Readings from Last 3 Encounters:  12/31/16 (!) 142/96. Home cuff 141/93.   11/11/16 (!) 142/92  09/22/16 (!) 148/72  A/P: We discussed blood pressure goal of <140/90. Start amlodipine 5mg  with 6 week follow up

## 2017-02-11 ENCOUNTER — Ambulatory Visit: Payer: BLUE CROSS/BLUE SHIELD | Admitting: Family Medicine

## 2017-11-24 ENCOUNTER — Encounter: Payer: Self-pay | Admitting: Internal Medicine

## 2018-02-26 DIAGNOSIS — Z23 Encounter for immunization: Secondary | ICD-10-CM | POA: Diagnosis not present

## 2018-03-30 ENCOUNTER — Encounter: Payer: Self-pay | Admitting: Family Medicine

## 2018-03-30 ENCOUNTER — Ambulatory Visit (INDEPENDENT_AMBULATORY_CARE_PROVIDER_SITE_OTHER): Payer: BLUE CROSS/BLUE SHIELD | Admitting: Family Medicine

## 2018-03-30 VITALS — BP 142/88 | HR 65 | Temp 98.3°F | Ht 71.5 in | Wt 161.0 lb

## 2018-03-30 DIAGNOSIS — Z125 Encounter for screening for malignant neoplasm of prostate: Secondary | ICD-10-CM

## 2018-03-30 DIAGNOSIS — E785 Hyperlipidemia, unspecified: Secondary | ICD-10-CM

## 2018-03-30 DIAGNOSIS — I10 Essential (primary) hypertension: Secondary | ICD-10-CM

## 2018-03-30 DIAGNOSIS — Z0000 Encounter for general adult medical examination without abnormal findings: Secondary | ICD-10-CM | POA: Diagnosis not present

## 2018-03-30 LAB — COMPREHENSIVE METABOLIC PANEL
ALK PHOS: 41 U/L (ref 39–117)
ALT: 14 U/L (ref 0–53)
AST: 17 U/L (ref 0–37)
Albumin: 4.5 g/dL (ref 3.5–5.2)
BUN: 17 mg/dL (ref 6–23)
CO2: 32 mEq/L (ref 19–32)
Calcium: 9.6 mg/dL (ref 8.4–10.5)
Chloride: 101 mEq/L (ref 96–112)
Creatinine, Ser: 1.03 mg/dL (ref 0.40–1.50)
GFR: 78.05 mL/min (ref >60.00)
Glucose, Bld: 101 mg/dL — ABNORMAL HIGH (ref 70–99)
POTASSIUM: 4.2 meq/L (ref 3.5–5.1)
Sodium: 139 mEq/L (ref 135–145)
TOTAL PROTEIN: 6.8 g/dL (ref 6.0–8.3)
Total Bilirubin: 1 mg/dL (ref 0.2–1.2)

## 2018-03-30 LAB — PSA: PSA: 0.44 ng/mL (ref 0.10–4.00)

## 2018-03-30 LAB — CBC
HCT: 40.3 % (ref 39.0–52.0)
HEMOGLOBIN: 13.6 g/dL (ref 13.0–17.0)
MCHC: 33.7 g/dL (ref 30.0–36.0)
MCV: 92.6 fl (ref 78.0–100.0)
Platelets: 220 10*3/uL (ref 150.0–400.0)
RBC: 4.35 Mil/uL (ref 4.22–5.81)
RDW: 14.3 % (ref 11.5–15.5)
WBC: 4.8 10*3/uL (ref 4.0–10.5)

## 2018-03-30 LAB — LIPID PANEL
CHOLESTEROL: 184 mg/dL (ref 0–200)
HDL: 63.6 mg/dL (ref >39.00)
LDL Cholesterol: 109 mg/dL — ABNORMAL HIGH (ref 0–99)
NonHDL: 120.21
Total CHOL/HDL Ratio: 3
Triglycerides: 56 mg/dL (ref 0.0–149.0)
VLDL: 11.2 mg/dL (ref 0.0–40.0)

## 2018-03-30 MED ORDER — AMLODIPINE BESYLATE 5 MG PO TABS: 5.0000 mg | ORAL_TABLET | Freq: Every day | ORAL | 3 refills | Status: DC

## 2018-03-30 NOTE — Addendum Note (Signed)
Addended by: Frutoso Chase A on: 03/30/2018 01:54 PM   Modules accepted: Orders

## 2018-03-30 NOTE — Progress Notes (Signed)
Phone: [phone]  Subjective:  Patient presents today for their annual physical. Chief complaint-noted.   See problem oriented charting- ROS- full  review of systems was completed and negative including No chest pain or shortness of breath. No headache or blurry vision.   The following were reviewed and entered/updated in epic: Past Medical History:  Diagnosis Date  . Allergy    seasonal  . Gilbert's syndrome   . Hyperlipidemia    Patient Active Problem List   Diagnosis Date Noted  . Hypertension 12/31/2016    Priority: Medium  . Hyperlipidemia 11/08/2008    Priority: Medium  . FASTING HYPERGLYCEMIA 11/08/2008    Priority: Medium  . Family history of ischemic heart disease 02/15/2011    Priority: Low  . NEVI, MULTIPLE 11/08/2008    Priority: Low  . 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 ALLERGIC RHINITIS 11/08/2008    Priority: Low   Past Surgical History:  Procedure Laterality Date  . COLONOSCOPY  2010   ,Negative  . LUMBAR EPIDURAL INJECTION  2007   3 ruptured discs  . UMBILICAL HERNIA REPAIR  2006   Dr Dalbert Batman    Family History  Problem Relation Age of Onset  . Hypertension Mother        retinitis pigmentosa females only  . Diabetes Mother   . Heart attack Father 19       cataracts on fathers side  . Hyperlipidemia Father   . Other Brother        does not know health history well  . Healthy Sister   . Emphysema Paternal Grandfather   . CAD Brother        stent placement  . Cancer Neg Hx   . Stroke Neg Hx     Medications- reviewed and updated Current Outpatient Medications  Medication Sig Dispense Refill  . amLODipine (NORVASC) 5 MG tablet Take 1 tablet (5 mg total) by mouth daily. 90 tablet 3   No current facility-administered medications for this visit.     Allergies-reviewed and updated No Known Allergies  Social History   Social History Narrative   Married 1980. 4 children. 8 grandkids.       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       Hobbies: fish, hunt.    Objective: BP (!) 142/88 (BP Location: Left Arm, Patient Position: Sitting, Cuff Size: Normal)   Pulse 65   Temp 98.3 F (36.8 C) (Oral)   Ht 5' 11.5" (1.816 m)   Wt 161 lb (73 kg)   SpO2 98%   BMI 22.14 kg/m  Gen: NAD, resting comfortably HEENT: Mucous membranes are moist. Oropharynx normal Neck: no thyromegaly CV: RRR no murmurs rubs or gallops Lungs: CTAB no crackles, wheeze, rhonchi Abdomen: soft/nontender/nondistended/normal bowel sounds.  Ext: no edema Skin: warm, dry Neuro: grossly normal, moves all extremities, PERRLA Rectal: normal tone, normal sized prostate, no masses or tenderness  Assessment/Plan:  62 y.o. male presenting for annual physical.  Health Maintenance counseling: 1. Anticipatory guidance: Patient counseled regarding regular dental exams -q6 months, eye exams -yearly, wearing seatbelts.  2. Risk factor reduction:  Advised patient of need for regular exercise and diet rich and fruits and vegetables to reduce risk of heart attack and stroke. Exercise- 5-7 days a week usually- enjoys weights but tries to do some cardio. Diet-eats a healthy diet but mainly one meal a day- has worked for him most of his life. Marland Kitchen  Wt Readings from Last 3 Encounters:  03/30/18 161 lb (73 kg)  12/31/16 156 lb 6.4 oz (70.9 kg)  11/11/16 161 lb 9.6 oz (73.3 kg)  3. Immunizations/screenings/ancillary studies- declines flu shot. Declined HIV and HCV screen last year. Received shingrix 02/26/18 [facility] clinic- will gt second one there.  Immunization History  Administered Date(s) Administered  . Tdap 02/15/2011  4. Prostate cancer screening-   Will trend psa, low risk rectal Lab Results  Component Value Date   PSA 0.38 11/12/2016   PSA 0.41 01/22/2014   PSA 0.20 02/15/2011   5. Colon cancer screening -  2010 with 10 year repeat planned- due next year 6. Skin cancer screening- sees Dr.  [place] Crane if any concerns- no hx skin cancer. advised regular sunscreen use. Denies worrisome, changing, or new skin lesions.  7. Never smoker   Status of chronic or acute concerns   Hypertension- controlled on amlodipine in the past- out for 2 months- refilled today- asked Him to send [place] a few home readings once back on medicine. When he was on meds in 120s to 130s over 70s to 80s range  HLD- mild- update lipids. Consider statin if above 7.5% risk due to family history. Also offered coronary CT   Allergies- doesn't take anything for this . Bothered him some in spring   Return in about 1 year (around 03/31/2019) for physical.  Lab/Order associations: NOT fasting Preventative health care - Plan: CBC, Comprehensive metabolic panel, Lipid panel, PSA  Essential hypertension - Plan: CBC, Comprehensive metabolic panel, Lipid panel  Hyperlipidemia, unspecified hyperlipidemia type - Plan: CBC, Comprehensive metabolic panel, Lipid panel  Screening for prostate cancer - Plan: PSA  Meds ordered this encounter  Medications  . amLODipine (NORVASC) 5 MG tablet    Sig: Take 1 tablet (5 mg total) by mouth daily.    Dispense:  90 tablet    Refill:  3    Return precautions advised.  Garret Reddish, MD

## 2018-03-30 NOTE — Patient Instructions (Addendum)
Please send [place] a few home readings after back on amlodipine for at least a week. Sent in for a year.   If 10 year risk of heart attack or stroke above 7.5% I would like to recommend cholesterol medicine- hoping # stays below that threshold though  Go ahead and schedule physical for next year- contact us before you run out of amlodipine- want to avoid BP going up   Please stop by lab before you go

## 2018-07-27 DIAGNOSIS — Z23 Encounter for immunization: Secondary | ICD-10-CM | POA: Diagnosis not present

## 2018-12-20 ENCOUNTER — Encounter: Payer: Self-pay | Admitting: Internal Medicine

## 2019-05-10 ENCOUNTER — Other Ambulatory Visit: Payer: Self-pay

## 2019-05-10 ENCOUNTER — Ambulatory Visit (INDEPENDENT_AMBULATORY_CARE_PROVIDER_SITE_OTHER): Payer: BC Managed Care – PPO | Admitting: Family Medicine

## 2019-05-10 ENCOUNTER — Encounter: Payer: Self-pay | Admitting: Family Medicine

## 2019-05-10 ENCOUNTER — Telehealth: Payer: Self-pay

## 2019-05-10 VITALS — BP 160/108 | Ht 71.0 in | Wt 157.0 lb

## 2019-05-10 DIAGNOSIS — Z125 Encounter for screening for malignant neoplasm of prostate: Secondary | ICD-10-CM

## 2019-05-10 DIAGNOSIS — E785 Hyperlipidemia, unspecified: Secondary | ICD-10-CM | POA: Diagnosis not present

## 2019-05-10 DIAGNOSIS — I10 Essential (primary) hypertension: Secondary | ICD-10-CM | POA: Diagnosis not present

## 2019-05-10 DIAGNOSIS — Z0000 Encounter for general adult medical examination without abnormal findings: Secondary | ICD-10-CM

## 2019-05-10 MED ORDER — AMLODIPINE BESYLATE 5 MG PO TABS: 5.0000 mg | ORAL_TABLET | Freq: Every day | ORAL | 3 refills | Status: DC

## 2019-05-10 NOTE — Telephone Encounter (Signed)
Copied from [place] [id number]. Topic: General - Other >> May 10, 2019  8:15 AM Leward Quan A wrote: Reason for CRM: Patient would like to come in 1 to 2 weeks early to have labs done before physical. Please advise

## 2019-05-10 NOTE — Patient Instructions (Signed)
Health Maintenance Due  Topic Date Due  . COLONOSCOPY  12/03/2018   Depression screen PHQ 2/9 03/30/2018  Decreased Interest 0  Down, Depressed, Hopeless 0  PHQ - 2 Score 0

## 2019-05-10 NOTE — Progress Notes (Signed)
Phone [phone]   Subjective:  Virtual visit via Video note. Chief complaint: Chief Complaint  Patient presents with  . Hypertension  This visit type was conducted due to national recommendations for restrictions regarding the COVID-19 Pandemic (e.g. social distancing).  This format is felt to be most appropriate for this patient at this time balancing risks to patient and risks to population by having him in for in person visit.  No physical exam was performed (except for noted visual exam or audio findings with Telehealth visits).    Our team/I connected with Okey Regal Syverson at  8:00 AM EDT by a video enabled telemedicine application (doxy.me or caregility through epic) and verified that I am speaking with the correct person using two identifiers.  Location patient: [place] Location provider: [facility], office Persons participating in the virtual visit:  patient  Our team/I discussed the limitations of evaluation and management by telemedicine and the availability of in person appointments. In light of current covid-19 pandemic, patient also understands that we are trying to protect them by minimizing in office contact if at all possible.  The patient expressed consent for telemedicine visit and agreed to proceed. Patient understands insurance will be billed.   ROS-no reported chest pain or shortness of breath.  Does report elevated blood pressures off medication-does not feel particularly bad with this.  No fever chills reported.  No headache or blurry vision  Past Medical History-  Patient Active Problem List   Diagnosis Date Noted  . Hypertension 12/31/2016    Priority: Medium  . Hyperlipidemia 11/08/2008    Priority: Medium  . FASTING HYPERGLYCEMIA 11/08/2008    Priority: Medium  . Family history of ischemic heart disease 02/15/2011    Priority: Low  . NEVI, MULTIPLE 11/08/2008    Priority: Low  . 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 ALLERGIC RHINITIS  11/08/2008    Priority: Low    Medications- reviewed and updated Current Outpatient Medications  Medication Sig Dispense Refill  . amLODipine (NORVASC) 5 MG tablet Take 1 tablet (5 mg total) by mouth daily. 90 tablet 3   No current facility-administered medications for this visit.      Objective:  BP (!) 160/108   Ht 5\' 11"  (1.803 m)   Wt 157 lb (71.2 kg)   BMI 21.90 kg/m  self reported vitals Gen: NAD, resting comfortably Lungs: nonlabored, normal respiratory rate  Skin: appears dry, no obvious rash    Assessment and Plan   #hypertension S: controlled on amlodipine 5 mg in the past but has run out recently.he checks his bp daily and he remembers last night his bp was 160/108.  Prior to running out of medication blood pressure would often get elevated in the afternoon into the 140s and then seemed to calm down if he had bourbon into the 120s over 70s-we discussed anxiety could be playing a role and he certainly agrees BP Readings from Last 3 Encounters:  05/10/19 (!) 160/108  03/30/18 (!) 142/88  12/31/16 (!) 142/96  A/P: Poor control today off amlodipine 5 mg-asked patient to update me in 2 to 3 weeks with updated readings and also encouraged physical in 6 months -Encouraged regular exercise which may help blood pressure well.  We also discussed possibly trying beet root juice since he does not have kidney disease  #hyperlipidemia S: Poorly controlled on last visit.  Patient states diet is stable and reasonably healthy.  He admits he has not been exercising in at  least the last year Lab Results  Component Value Date   CHOL 184 03/30/2018   HDL 63.60 03/30/2018   LDLCALC 109 (H) 03/30/2018   LDLDIRECT 125.4 11/06/2008   TRIG 56.0 03/30/2018   CHOLHDL 3 03/30/2018   A/P: Weight is largely stable.  Diet is reasonable.  Exercise is down/very rare-encouraged regular exercise to try to help with lipid levels.  We will recheck at physical in 6 months   Recommended follow up:  18-month physical advised.  +2-week MyChart update on blood pressure  Lab/Order associations:   ICD-10-CM   1. Essential hypertension  I10   2. Hyperlipidemia, unspecified hyperlipidemia type  E78.5     Meds ordered this encounter  Medications  . amLODipine (NORVASC) 5 MG tablet    Sig: Take 1 tablet (5 mg total) by mouth daily.    Dispense:  90 tablet    Refill:  3    Return precautions advised.  Garret Reddish, MD

## 2019-05-10 NOTE — Telephone Encounter (Signed)
Called and made lab appointment with pt.

## 2019-05-10 NOTE — Telephone Encounter (Signed)
Labs ordered.

## 2019-06-20 ENCOUNTER — Other Ambulatory Visit: Payer: Self-pay

## 2019-06-20 ENCOUNTER — Telehealth: Payer: Self-pay | Admitting: Family Medicine

## 2019-06-20 MED ORDER — AMLODIPINE BESYLATE 10 MG PO TABS: 10.0000 mg | ORAL_TABLET | Freq: Every day | ORAL | 3 refills | Status: DC

## 2019-06-20 NOTE — Telephone Encounter (Signed)
Called and made pt aware of below information. He will call back to schedule his 2 week BP check.

## 2019-06-20 NOTE — Telephone Encounter (Signed)
Increase his amlodipine to 10 mg #90 with 3 refills for daily use.  Have him monitor for ankle swelling.  Recommend 2-week blood pressure follow-up-he may need additional support/medication.  Make sure to take medication consistently in the evening.

## 2019-06-20 NOTE — Telephone Encounter (Signed)
Pt wife calling in for an update, I tried reaching out to pt to get more info however pt is at work and phone went to [employer]. See below.

## 2019-06-20 NOTE — Telephone Encounter (Signed)
Pt had virtual appt with dr hunter on 05-10-2019. Pt wife is calling to let md know the bp med is not working. Pt average bp is 158/136. Pt wife is not aware if he is having headaches.  Pt wife does not know the bp today. Pt is a work.  [employer] [address]

## 2019-06-20 NOTE — Telephone Encounter (Signed)
See note, please advise if another appointment is needed

## 2019-10-05 ENCOUNTER — Other Ambulatory Visit: Payer: Self-pay

## 2019-10-09 ENCOUNTER — Encounter: Payer: BC Managed Care – PPO | Admitting: Family Medicine

## 2020-05-19 ENCOUNTER — Telehealth: Payer: Self-pay

## 2020-05-19 NOTE — Telephone Encounter (Signed)
LVM for patient to call back and schedule appt with Dr. Yong Channel.

## 2020-07-07 ENCOUNTER — Other Ambulatory Visit: Payer: Self-pay | Admitting: Family Medicine

## 2021-02-23 ENCOUNTER — Telehealth: Payer: Self-pay

## 2021-02-23 MED ORDER — AMLODIPINE BESYLATE 10 MG PO TABS: 10.0000 mg | ORAL_TABLET | Freq: Every day | ORAL | 0 refills | Status: AC

## 2021-02-23 NOTE — Telephone Encounter (Signed)
  LAST APPOINTMENT DATE:  05/10/2019  NEXT APPOINTMENT DATE:'@8'$ /26/2022  MEDICATION: amLODipine (NORVASC) 10 MG tablet  PHARMACY:  [facility] [address] [place] [id number]  Comments: Wife is requesting a 30 day supply to last until the appointment in August.

## 2021-02-23 NOTE — Telephone Encounter (Signed)
Spoke to pt 's wife Hoyle Sauer told her 30 day supply of blood pressure medication was sent to pharmacy. Hoyle Sauer verbalized understanding.

## 2021-02-26 ENCOUNTER — Other Ambulatory Visit: Payer: Self-pay

## 2021-02-26 NOTE — Telephone Encounter (Signed)
Called and spoke with pharmacy and they state the Rx is on file and they will go ahead and fill it for pt.

## 2021-02-26 NOTE — Telephone Encounter (Signed)
Patient's wife is calling in stating that the pharmacy still doesn't have it.

## 2021-03-27 ENCOUNTER — Ambulatory Visit: Payer: Self-pay | Admitting: Family Medicine

## 2021-08-18 ENCOUNTER — Encounter: Payer: Self-pay | Admitting: Family Medicine

## 2022-04-15 DIAGNOSIS — Z0001 Encounter for general adult medical examination with abnormal findings: Secondary | ICD-10-CM | POA: Diagnosis not present

## 2022-04-15 DIAGNOSIS — I10 Essential (primary) hypertension: Secondary | ICD-10-CM | POA: Diagnosis not present

## 2022-05-31 DIAGNOSIS — I10 Essential (primary) hypertension: Secondary | ICD-10-CM | POA: Diagnosis not present

## 2022-06-07 DIAGNOSIS — I10 Essential (primary) hypertension: Secondary | ICD-10-CM | POA: Diagnosis not present

## 2022-06-07 DIAGNOSIS — S61512A Laceration without foreign body of left wrist, initial encounter: Secondary | ICD-10-CM | POA: Diagnosis not present

## 2022-06-07 DIAGNOSIS — W268XXA Contact with other sharp object(s), not elsewhere classified, initial encounter: Secondary | ICD-10-CM | POA: Diagnosis not present

## 2022-06-16 DIAGNOSIS — T1502XA Foreign body in cornea, left eye, initial encounter: Secondary | ICD-10-CM | POA: Diagnosis not present

## 2022-06-17 DIAGNOSIS — S0502XA Injury of conjunctiva and corneal abrasion without foreign body, left eye, initial encounter: Secondary | ICD-10-CM | POA: Diagnosis not present

## 2022-06-18 DIAGNOSIS — S0502XD Injury of conjunctiva and corneal abrasion without foreign body, left eye, subsequent encounter: Secondary | ICD-10-CM | POA: Diagnosis not present

## 2022-06-18 DIAGNOSIS — Z4802 Encounter for removal of sutures: Secondary | ICD-10-CM | POA: Diagnosis not present

## 2022-06-18 DIAGNOSIS — I10 Essential (primary) hypertension: Secondary | ICD-10-CM | POA: Diagnosis not present

## 2022-06-28 DIAGNOSIS — I10 Essential (primary) hypertension: Secondary | ICD-10-CM | POA: Diagnosis not present

## 2022-06-28 DIAGNOSIS — S62306A Unspecified fracture of fifth metacarpal bone, right hand, initial encounter for closed fracture: Secondary | ICD-10-CM | POA: Diagnosis not present

## 2022-06-28 DIAGNOSIS — Z79899 Other long term (current) drug therapy: Secondary | ICD-10-CM | POA: Diagnosis not present

## 2022-06-28 DIAGNOSIS — S638X1A Sprain of other part of right wrist and hand, initial encounter: Secondary | ICD-10-CM | POA: Diagnosis not present

## 2022-06-29 DIAGNOSIS — M79641 Pain in right hand: Secondary | ICD-10-CM | POA: Diagnosis not present

## 2022-07-20 DIAGNOSIS — I10 Essential (primary) hypertension: Secondary | ICD-10-CM | POA: Diagnosis not present

## 2022-07-21 DIAGNOSIS — S0502XD Injury of conjunctiva and corneal abrasion without foreign body, left eye, subsequent encounter: Secondary | ICD-10-CM | POA: Diagnosis not present

## 2022-07-27 DIAGNOSIS — M79641 Pain in right hand: Secondary | ICD-10-CM | POA: Diagnosis not present

## 2022-08-11 DIAGNOSIS — Z0100 Encounter for examination of eyes and vision without abnormal findings: Secondary | ICD-10-CM | POA: Diagnosis not present

## 2022-08-17 DIAGNOSIS — I10 Essential (primary) hypertension: Secondary | ICD-10-CM | POA: Diagnosis not present

## 2022-08-17 DIAGNOSIS — Z20822 Contact with and (suspected) exposure to covid-19: Secondary | ICD-10-CM | POA: Diagnosis not present

## 2022-08-24 DIAGNOSIS — M79641 Pain in right hand: Secondary | ICD-10-CM | POA: Diagnosis not present

## 2022-09-14 DIAGNOSIS — I10 Essential (primary) hypertension: Secondary | ICD-10-CM | POA: Diagnosis not present

## 2022-10-12 DIAGNOSIS — I10 Essential (primary) hypertension: Secondary | ICD-10-CM | POA: Diagnosis not present

## 2022-10-12 DIAGNOSIS — Z20822 Contact with and (suspected) exposure to covid-19: Secondary | ICD-10-CM | POA: Diagnosis not present

## 2023-06-02 DIAGNOSIS — Z20822 Contact with and (suspected) exposure to covid-19: Secondary | ICD-10-CM | POA: Diagnosis not present

## 2023-06-02 DIAGNOSIS — I10 Essential (primary) hypertension: Secondary | ICD-10-CM | POA: Diagnosis not present

## 2023-07-25 DIAGNOSIS — H2513 Age-related nuclear cataract, bilateral: Secondary | ICD-10-CM | POA: Diagnosis not present

## 2024-01-02 DIAGNOSIS — I10 Essential (primary) hypertension: Secondary | ICD-10-CM | POA: Diagnosis not present

## 2024-03-02 DIAGNOSIS — S99921A Unspecified injury of right foot, initial encounter: Secondary | ICD-10-CM | POA: Diagnosis not present
